# Patient Record
Sex: Male | Born: 2000 | Race: White | Hispanic: No | Marital: Single | Smoking: Former smoker
Health system: Southern US, Community
[De-identification: ages and names within clinical notes are randomized; demographics above are authoritative.]

## PROBLEM LIST (undated history)

## (undated) DIAGNOSIS — Z789 Other specified health status: Secondary | ICD-10-CM

## (undated) HISTORY — PX: NO PAST SURGERIES: SHX2092

## (undated) HISTORY — DX: Other specified health status: Z78.9

---
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
## 2018-12-10 ENCOUNTER — Other Ambulatory Visit: Payer: Self-pay

## 2018-12-10 ENCOUNTER — Ambulatory Visit (INDEPENDENT_AMBULATORY_CARE_PROVIDER_SITE_OTHER): Payer: Commercial Managed Care - PPO | Admitting: Sports Medicine

## 2018-12-10 ENCOUNTER — Encounter: Payer: Self-pay | Admitting: Sports Medicine

## 2018-12-10 ENCOUNTER — Ambulatory Visit (INDEPENDENT_AMBULATORY_CARE_PROVIDER_SITE_OTHER): Payer: Commercial Managed Care - PPO

## 2018-12-10 ENCOUNTER — Ambulatory Visit: Payer: Commercial Managed Care - PPO

## 2018-12-10 DIAGNOSIS — S62337A Displaced fracture of neck of fifth metacarpal bone, left hand, initial encounter for closed fracture: Secondary | ICD-10-CM

## 2018-12-10 DIAGNOSIS — S6992XA Unspecified injury of left wrist, hand and finger(s), initial encounter: Secondary | ICD-10-CM | POA: Diagnosis not present

## 2018-12-10 MED ORDER — HYDROCODONE-ACETAMINOPHEN 5-325 MG PO TABS: 1.0000 | ORAL_TABLET | Freq: Three times a day (TID) | ORAL | 0 refills | Status: DC | PRN

## 2018-12-10 NOTE — Assessment & Plan Note (Signed)
Mildly angulated closed reduction under hematoma block today. Boxers Exos cast applied. Postreduction films today. Hydrocodone for postprocedural pain. Return to see me in 1 week.
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
## 2018-12-17 ENCOUNTER — Ambulatory Visit (INDEPENDENT_AMBULATORY_CARE_PROVIDER_SITE_OTHER): Payer: Commercial Managed Care - PPO

## 2018-12-17 ENCOUNTER — Other Ambulatory Visit: Payer: Self-pay

## 2018-12-17 ENCOUNTER — Ambulatory Visit (INDEPENDENT_AMBULATORY_CARE_PROVIDER_SITE_OTHER): Payer: Commercial Managed Care - PPO | Admitting: Sports Medicine

## 2018-12-17 DIAGNOSIS — S62337D Displaced fracture of neck of fifth metacarpal bone, left hand, subsequent encounter for fracture with routine healing: Secondary | ICD-10-CM

## 2018-12-17 NOTE — Progress Notes (Signed)
  Subjective: Approximately 1 week post closed reduction of the left fifth metacarpal neck fracture, doing well.  Objective: General: Well-developed, well-nourished, and in no acute distress. Left hand: Not really tender over the fracture.  X-rays reviewed, there is good stability of the reduction.  Assessment/plan:   Closed displaced fracture of neck of fifth metacarpal bone of left hand X-rays look good, reduction is stable 1 week post. Very little pain. Return in 1 month, x-ray before visit. Continue Exos boxers cast.    ___________________________________________ Gwen Her. Dianah Field, M.D., ABFM., CAQSM. Primary Care and Sports Medicine [facility]  Adjunct Professor of [place] [employer] [facility] [employer]

## 2018-12-17 NOTE — Assessment & Plan Note (Addendum)
X-rays look good, reduction is stable 1 week post. Very little pain. Return in 1 month, x-ray before visit. Continue Exos boxers cast.

## 2019-01-14 ENCOUNTER — Ambulatory Visit (INDEPENDENT_AMBULATORY_CARE_PROVIDER_SITE_OTHER): Payer: Commercial Managed Care - PPO

## 2019-01-14 ENCOUNTER — Other Ambulatory Visit: Payer: Self-pay

## 2019-01-14 ENCOUNTER — Ambulatory Visit (INDEPENDENT_AMBULATORY_CARE_PROVIDER_SITE_OTHER): Payer: Commercial Managed Care - PPO | Admitting: Sports Medicine

## 2019-01-14 DIAGNOSIS — S62337D Displaced fracture of neck of fifth metacarpal bone, left hand, subsequent encounter for fracture with routine healing: Secondary | ICD-10-CM
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
## 2019-01-14 NOTE — Assessment & Plan Note (Addendum)
4 weeks post close reduction, good anatomic alignment, good bony callus. May discontinue brace, activities of daily living, no lifting greater than 10 pounds, return to see me in a month, rehab exercises to return range of motion given.

## 2019-02-11 ENCOUNTER — Ambulatory Visit (INDEPENDENT_AMBULATORY_CARE_PROVIDER_SITE_OTHER): Payer: Commercial Managed Care - PPO | Admitting: Sports Medicine

## 2019-02-11 ENCOUNTER — Other Ambulatory Visit: Payer: Self-pay

## 2019-02-11 DIAGNOSIS — S62337D Displaced fracture of neck of fifth metacarpal bone, left hand, subsequent encounter for fracture with routine healing: Secondary | ICD-10-CM
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
